# Patient Record
Sex: Male | Born: 1935 | Race: White | Hispanic: No | Smoking: Never smoker
Health system: Southern US, Community
[De-identification: ages and names within clinical notes are randomized; demographics above are authoritative.]

## PROBLEM LIST (undated history)

## (undated) DIAGNOSIS — F419 Anxiety disorder, unspecified: Secondary | ICD-10-CM

---

## 2019-11-10 ENCOUNTER — Encounter (HOSPITAL_COMMUNITY): Payer: Self-pay

## 2019-11-10 ENCOUNTER — Emergency Department (HOSPITAL_COMMUNITY): Payer: Medicare Other

## 2019-11-10 ENCOUNTER — Other Ambulatory Visit: Payer: Self-pay

## 2019-11-10 ENCOUNTER — Inpatient Hospital Stay (HOSPITAL_COMMUNITY)
Admission: EM | Admit: 2019-11-10 | Discharge: 2019-12-10 | DRG: 871 | Disposition: E | Payer: Medicare Other | Source: Skilled Nursing Facility | Attending: Internal Medicine | Admitting: Internal Medicine

## 2019-11-10 DIAGNOSIS — N179 Acute kidney failure, unspecified: Secondary | ICD-10-CM | POA: Diagnosis present

## 2019-11-10 DIAGNOSIS — I4891 Unspecified atrial fibrillation: Secondary | ICD-10-CM | POA: Diagnosis present

## 2019-11-10 DIAGNOSIS — I214 Non-ST elevation (NSTEMI) myocardial infarction: Secondary | ICD-10-CM | POA: Diagnosis present

## 2019-11-10 DIAGNOSIS — J1289 Other viral pneumonia: Secondary | ICD-10-CM | POA: Diagnosis present

## 2019-11-10 DIAGNOSIS — I469 Cardiac arrest, cause unspecified: Secondary | ICD-10-CM

## 2019-11-10 DIAGNOSIS — Z66 Do not resuscitate: Secondary | ICD-10-CM | POA: Diagnosis present

## 2019-11-10 DIAGNOSIS — A4189 Other specified sepsis: Principal | ICD-10-CM | POA: Diagnosis present

## 2019-11-10 DIAGNOSIS — J9601 Acute respiratory failure with hypoxia: Secondary | ICD-10-CM | POA: Diagnosis not present

## 2019-11-10 DIAGNOSIS — I10 Essential (primary) hypertension: Secondary | ICD-10-CM | POA: Diagnosis present

## 2019-11-10 DIAGNOSIS — G9340 Encephalopathy, unspecified: Secondary | ICD-10-CM | POA: Diagnosis present

## 2019-11-10 DIAGNOSIS — I200 Unstable angina: Secondary | ICD-10-CM

## 2019-11-10 DIAGNOSIS — F0390 Unspecified dementia without behavioral disturbance: Secondary | ICD-10-CM | POA: Diagnosis present

## 2019-11-10 DIAGNOSIS — U071 COVID-19: Secondary | ICD-10-CM | POA: Diagnosis present

## 2019-11-10 DIAGNOSIS — J1282 Pneumonia due to coronavirus disease 2019: Secondary | ICD-10-CM | POA: Diagnosis present

## 2019-11-10 HISTORY — DX: Anxiety disorder, unspecified: F41.9

## 2019-11-10 LAB — CBC WITH DIFFERENTIAL/PLATELET
Abs Immature Granulocytes: 0.12 10*3/uL — ABNORMAL HIGH (ref 0.00–0.07)
Basophils Absolute: 0 10*3/uL (ref 0.0–0.1)
Basophils Relative: 0 %
Eosinophils Absolute: 0 10*3/uL (ref 0.0–0.5)
Eosinophils Relative: 0 %
HCT: 51.3 % (ref 39.0–52.0)
Hemoglobin: 17.1 g/dL — ABNORMAL HIGH (ref 13.0–17.0)
Immature Granulocytes: 1 %
Lymphocytes Relative: 4 %
Lymphs Abs: 0.5 10*3/uL — ABNORMAL LOW (ref 0.7–4.0)
MCH: 31.8 pg (ref 26.0–34.0)
MCHC: 33.3 g/dL (ref 30.0–36.0)
MCV: 95.5 fL (ref 80.0–100.0)
Monocytes Absolute: 1.2 10*3/uL — ABNORMAL HIGH (ref 0.1–1.0)
Monocytes Relative: 8 %
Neutro Abs: 12.9 10*3/uL — ABNORMAL HIGH (ref 1.7–7.7)
Neutrophils Relative %: 87 %
Platelets: 170 10*3/uL (ref 150–400)
RBC: 5.37 MIL/uL (ref 4.22–5.81)
RDW: 14.8 % (ref 11.5–15.5)
WBC: 14.7 10*3/uL — ABNORMAL HIGH (ref 4.0–10.5)
nRBC: 0 % (ref 0.0–0.2)

## 2019-11-10 LAB — COMPREHENSIVE METABOLIC PANEL
ALT: 26 U/L (ref 0–44)
AST: 62 U/L — ABNORMAL HIGH (ref 15–41)
Albumin: 3.7 g/dL (ref 3.5–5.0)
Alkaline Phosphatase: 55 U/L (ref 38–126)
Anion gap: 17 — ABNORMAL HIGH (ref 5–15)
BUN: 45 mg/dL — ABNORMAL HIGH (ref 8–23)
CO2: 14 mmol/L — ABNORMAL LOW (ref 22–32)
Calcium: 9.6 mg/dL (ref 8.9–10.3)
Chloride: 112 mmol/L — ABNORMAL HIGH (ref 98–111)
Creatinine, Ser: 3.01 mg/dL — ABNORMAL HIGH (ref 0.61–1.24)
GFR calc Af Amer: 20 mL/min — ABNORMAL LOW (ref >60)
GFR calc non Af Amer: 18 mL/min — ABNORMAL LOW (ref >60)
Glucose, Bld: 138 mg/dL — ABNORMAL HIGH (ref 70–99)
Potassium: 4.4 mmol/L (ref 3.5–5.1)
Sodium: 143 mmol/L (ref 135–145)
Total Bilirubin: 1.2 mg/dL (ref 0.3–1.2)
Total Protein: 8.3 g/dL — ABNORMAL HIGH (ref 6.5–8.1)

## 2019-11-10 LAB — D-DIMER, QUANTITATIVE: D-Dimer, Quant: 5.57 ug/mL-FEU — ABNORMAL HIGH (ref 0.00–0.50)

## 2019-11-10 LAB — LACTATE DEHYDROGENASE: LDH: 346 U/L — ABNORMAL HIGH (ref 98–192)

## 2019-11-10 LAB — TRIGLYCERIDES: Triglycerides: 96 mg/dL (ref <150)

## 2019-11-10 LAB — POC SARS CORONAVIRUS 2 AG -  ED: SARS Coronavirus 2 Ag: POSITIVE — AB

## 2019-11-10 LAB — PROCALCITONIN: Procalcitonin: 4.19 ng/mL

## 2019-11-10 LAB — LACTIC ACID, PLASMA
Lactic Acid, Venous: 3.6 mmol/L (ref 0.5–1.9)
Lactic Acid, Venous: 3.4 mmol/L (ref 0.5–1.9)

## 2019-11-10 LAB — TROPONIN I (HIGH SENSITIVITY)
Troponin I (High Sensitivity): 1933 ng/L (ref <18)
Troponin I (High Sensitivity): 5133 ng/L (ref <18)

## 2019-11-10 LAB — FIBRINOGEN: Fibrinogen: 619 mg/dL — ABNORMAL HIGH (ref 210–475)

## 2019-11-10 MED ORDER — HEPARIN BOLUS VIA INFUSION
4500.0000 [IU] | Freq: Once | INTRAVENOUS | Status: AC
  Administered 2019-11-10: 4500 [IU] via INTRAVENOUS
  Filled 2019-11-10: qty 4500

## 2019-11-10 MED ORDER — SODIUM CHLORIDE 0.9 % IV SOLN: 100.0000 mg | INTRAVENOUS | Status: DC

## 2019-11-10 MED ORDER — METOPROLOL TARTRATE 5 MG/5ML IV SOLN
10.0000 mg | Freq: Once | INTRAVENOUS | Status: AC
  Administered 2019-11-10: 10 mg via INTRAVENOUS
  Filled 2019-11-10: qty 10

## 2019-11-10 MED ORDER — SODIUM CHLORIDE 0.9 % IV SOLN
2.0000 g | Freq: Once | INTRAVENOUS | Status: AC
  Administered 2019-11-10: 2 g via INTRAVENOUS
  Filled 2019-11-10: qty 2

## 2019-11-10 MED ORDER — VANCOMYCIN HCL IN DEXTROSE 1-5 GM/200ML-% IV SOLN
1000.0000 mg | Freq: Once | INTRAVENOUS | Status: AC
  Administered 2019-11-10: 1000 mg via INTRAVENOUS
  Filled 2019-11-10: qty 200

## 2019-11-10 MED ORDER — HEPARIN (PORCINE) 25000 UT/250ML-% IV SOLN
1200.0000 [IU]/h | INTRAVENOUS | Status: DC
  Administered 2019-11-10: 1200 [IU]/h via INTRAVENOUS
  Filled 2019-11-10: qty 250

## 2019-11-10 MED ORDER — ACETAMINOPHEN 325 MG PO TABS: 650.0000 mg | ORAL_TABLET | Freq: Four times a day (QID) | ORAL | Status: DC | PRN

## 2019-11-10 MED ORDER — DILTIAZEM HCL 25 MG/5ML IV SOLN
10.0000 mg | Freq: Once | INTRAVENOUS | Status: AC
  Administered 2019-11-10: 10 mg via INTRAVENOUS
  Filled 2019-11-10: qty 5

## 2019-11-10 MED ORDER — DILTIAZEM HCL-DEXTROSE 125-5 MG/125ML-% IV SOLN (PREMIX)
5.0000 mg/h | INTRAVENOUS | Status: DC
  Administered 2019-11-10: 5 mg/h via INTRAVENOUS
  Filled 2019-11-10: qty 125

## 2019-11-10 MED ORDER — ACETAMINOPHEN 650 MG RE SUPP: 650.0000 mg | Freq: Four times a day (QID) | RECTAL | Status: DC | PRN

## 2019-11-10 MED ORDER — ACETAMINOPHEN 650 MG RE SUPP
650.0000 mg | Freq: Once | RECTAL | Status: AC
  Administered 2019-11-10: 650 mg via RECTAL
  Filled 2019-11-10: qty 1

## 2019-11-10 MED ORDER — ONDANSETRON HCL 4 MG PO TABS: 4.0000 mg | ORAL_TABLET | Freq: Four times a day (QID) | ORAL | Status: DC | PRN

## 2019-11-10 MED ORDER — DEXAMETHASONE SODIUM PHOSPHATE 10 MG/ML IJ SOLN
10.0000 mg | Freq: Once | INTRAMUSCULAR | Status: AC
  Administered 2019-11-10: 10 mg via INTRAVENOUS
  Filled 2019-11-10: qty 1

## 2019-11-10 MED ORDER — DEXAMETHASONE 4 MG PO TABS: 6.0000 mg | ORAL_TABLET | ORAL | Status: DC

## 2019-11-10 MED ORDER — ONDANSETRON HCL 4 MG/2ML IJ SOLN: 4.0000 mg | Freq: Four times a day (QID) | INTRAMUSCULAR | Status: DC | PRN

## 2019-11-10 MED ORDER — SODIUM CHLORIDE 0.9 % IV SOLN
200.0000 mg | Freq: Once | INTRAVENOUS | Status: DC
  Filled 2019-11-10: qty 40

## 2019-11-11 LAB — C-REACTIVE PROTEIN

## 2019-11-11 LAB — ABO/RH: ABO/RH(D): O POS

## 2019-11-11 LAB — FERRITIN

## 2019-11-15 LAB — CULTURE, BLOOD (ROUTINE X 2)
Culture: NO GROWTH
Culture: NO GROWTH
Special Requests: ADEQUATE

## 2019-12-10 NOTE — H&P (Signed)
History and Physical    Mark Benson POE:[id number] DOB: [birth year] DOA: 12/05/2019  PCP: No primary care provider on file.   Patient coming from: From [place] home.  Chief Complaint: Palpitation shortness of breath.  History obtained from ER physician as patient has dementia and no family available.  No previous chart available to review.  HPI: Mark Benson is a 86 y.o. male with history of A. fib hypertension dementia DNR status was brought into the ER from the nursing home after patient became short of breath and was having palpitations.  Initially they thought it was anxiety but later on was found to be tachycardic and EMS was called and brought to the ER.  Patient had recently tested positive for COVID-19 infection.  ED Course: In the ER patient was short of breath hypoxic with chest x-ray showing.  EKG was showing A. fib with RVR.  Labs show creatinine of 3 anion gap 17 AST at 62 LDH 346 troponin XIX 33 lactic acid 3.6 procalcitonin 4.1 WBC 14.7 hemoglobin 17.1 platelets 170 Covid test was positive.  Blood cultures were obtained started on empiric antibiotics and for A. fib and non-ST relation MI ER physician discussed with Dr. Terrence Dupont on-call cardiologist.  Patient was started on Cardizem infusion heparin and cardiology advised as needed IV metoprolol to control heart rate.  Will be seeing patient in consult.  At the time of my exam patient is in acute respiratory failure.  No family available to communicate.  Appears edematous in the lower extremities.  Review of Systems: As per HPI, rest all negative.   Past Medical History:  Diagnosis Date  . Anxiety     History reviewed. No pertinent surgical history.   reports that he has never smoked. He has never used smokeless tobacco. No history on file for alcohol and drug.  No Known Allergies  Family History  Family history unknown: Yes    Prior to Admission medications   Not on File    Physical Exam:  Constitutional: Moderately built and nourished. Vitals:   12/04/2019 1916 12/09/2019 1940 12/09/2019 1956 11/24/2019 2010  BP: (!) 142/101 (!) 127/113 (!) 151/120 (!) 165/152  Pulse: (!) 110 68 (!) 151 (!) 158  Resp: 14  (!) 47 (!) 26  SpO2: 96% 97% 96% 94%  Weight:      Height:       Eyes: Anicteric no pallor. ENMT: No discharge from the ears eyes nose or mouth. Neck: No mass felt.  No neck rigidity.  JVD elevated. Respiratory: No rhonchi or crepitations. Cardiovascular: S1-S2 heard. Abdomen: Soft nontender bowel sound present. Musculoskeletal: Bilateral lower extremity edema present. Skin: No rash. Neurologic: Alert awake does not follow commands. Psychiatric: Appears confused.   Labs on Admission: I have personally reviewed following labs and imaging studies  CBC: Recent Labs  Lab 12/07/2019 1830  WBC 14.7*  NEUTROABS 12.9*  HGB 17.1*  HCT 51.3  MCV 95.5  PLT 315   Basic Metabolic Panel: Recent Labs  Lab 11/27/2019 1830  NA 143  K 4.4  CL 112*  CO2 14*  GLUCOSE 138*  BUN 45*  CREATININE 3.01*  CALCIUM 9.6   GFR: Estimated Creatinine Clearance: 17.8 mL/min (A) (by C-G formula based on SCr of 3.01 mg/dL (H)). Liver Function Tests: Recent Labs  Lab 11/26/2019 1830  AST 62*  ALT 26  ALKPHOS 55  BILITOT 1.2  PROT 8.3*  ALBUMIN 3.7   No results for input(s): LIPASE, AMYLASE in the last 168  hours. No results for input(s): AMMONIA in the last 168 hours. Coagulation Profile: No results for input(s): INR, PROTIME in the last 168 hours. Cardiac Enzymes: No results for input(s): CKTOTAL, CKMB, CKMBINDEX, TROPONINI in the last 168 hours. BNP (last 3 results) No results for input(s): PROBNP in the last 8760 hours. HbA1C: No results for input(s): HGBA1C in the last 72 hours. CBG: No results for input(s): GLUCAP in the last 168 hours. Lipid Profile: Recent Labs    11/12/2019 1830  TRIG 96   Thyroid Function Tests: No results for input(s): TSH, T4TOTAL, FREET4,  T3FREE, THYROIDAB in the last 72 hours. Anemia Panel: No results for input(s): VITAMINB12, FOLATE, FERRITIN, TIBC, IRON, RETICCTPCT in the last 72 hours. Urine analysis: No results found for: COLORURINE, APPEARANCEUR, LABSPEC, PHURINE, GLUCOSEU, HGBUR, BILIRUBINUR, KETONESUR, PROTEINUR, UROBILINOGEN, NITRITE, LEUKOCYTESUR Sepsis Labs: @LABRCNTIP (procalcitonin:4,lacticidven:4) )No results found for this or any previous visit (from the past 240 hour(s)).   Radiological Exams on Admission: Dg Chest Port 1 View  Result Date: 11/23/2019 CLINICAL DATA:  Shortness of breath and tachycardia EXAM: PORTABLE CHEST 1 VIEW COMPARISON:  None. FINDINGS: Cardiomegaly. Left greater than right mid to lower lung airspace disease. No pleural effusion. Aortic atherosclerosis. No pneumothorax. IMPRESSION: Left greater than right basilar airspace disease concerning for bilateral pneumonia. Cardiomegaly Electronically Signed   By: 14/01/2019 M.D.   On: 11/26/2019 18:42    EKG: Independently reviewed.  A. fib with RVR.  Assessment/Plan Principal Problem:   Acute respiratory failure with hypoxia (HCC) Active Problems:   Atrial fibrillation with RVR (HCC)   ARF (acute renal failure) (HCC)   Acute encephalopathy   Pneumonia due to COVID-19 virus   Non-ST elevation MI (NSTEMI) (HCC)    1. Acute respiratory failure with hypoxia likely from Covid pneumonia and also possible sepsis.  Patient has been started on empiric antibiotics follow cultures and will keep patient on Decadron and remdesivir for Covid pneumonia.  Continue to check lactic acid and procalcitonin. 2. A. fib with RVR likely precipitated by sepsis and hypoxic respiratory failure on Cardizem infusion and cardiology recommended as needed IV metoprolol as needed.  On heparin now.  Check TSH cardiac markers 2D echo. 3. Non-ST relation MI likely precipitate by A. fib with RVR -Dr. 14/01/2019 on-call cardiologist is aware.  Will be seeing patient in consult.   Presently on heparin will check 2D echo presently n.p.o.  Add aspirin.  On Cardizem for heart rate control and as needed metoprolol. 4. Acute renal failure no old labs to compare.  Closely monitor.  Check intake output follow UA if any. 5. History of dementia.  Patient is critically ill and will need inpatient status.  Family contact number not available to communicate.   DVT prophylaxis: Heparin infusion. Code Status: DNR as per the records transferred from the nursing home. Family Communication: Unable to reach family. Disposition Plan: To be determined. Consults called: Cardiology. Admission status: Inpatient.   Sharyn Lull MD [facility] Pager [phone].  If 7PM-7AM, please contact night-coverage [email or web address] Password [email or web address]  11/19/2019, 8:30 PM

## 2019-12-10 NOTE — Progress Notes (Signed)
ANTICOAGULATION CONSULT NOTE - Initial Consult  Pharmacy Consult for heparin Indication: atrial fibrillation  No Known Allergies  Patient Measurements: Height: 5\' 7"  (170.2 cm) Weight: 190 lb (86.2 kg) IBW/kg (Calculated) : 66.1 Heparin Dosing Weight: 83.7kg  Vital Signs: BP: 127/113 (12/02 1940) Pulse Rate: 68 (12/02 1940)  Labs: Recent Labs    12/05/2019 1830  HGB 17.1*  HCT 51.3  PLT 170  CREATININE 3.01*  TROPONINIHS 1,933*    Estimated Creatinine Clearance: 17.8 mL/min (A) (by C-G formula based on SCr of 3.01 mg/dL (H)).   Medical History: Past Medical History:  Diagnosis Date  . Anxiety     Medications:  Infusions:  . diltiazem (CARDIZEM) infusion 10 mg/hr (11/26/2019 1958)  . heparin      Assessment: 25 yom presented to the ED with respiratory distress. Recent diagnosis of COVID. Found to be in afib and now starting IV heparin. He is not on anticoagulation PTA. Baseline CBC is WNL. Tropnin and d-dimer are elevated.   Goal of Therapy:  Heparin level 0.3-0.7 units/ml Monitor platelets by anticoagulation protocol: Yes   Plan:  Heparin bolus 4500 units IV x 1 Heparin gtt 1200 units/hr Check an 8 hr heparin level Daily heparin level and CBC  Federico Maiorino, Rande Lawman 12/05/2019,8:11 PM

## 2019-12-10 NOTE — ED Provider Notes (Signed)
[facility] EMERGENCY DEPARTMENT Provider Note   CSN: [id number] Arrival date & time: 16-Nov-2019  1809     History   Chief Complaint Chief Complaint  Patient presents with  . Respiratory Distress    HPI Mark Benson is a 86 y.o. male hx of afib, HTN, recent Covid positive, here presenting with palpitations, rapid afib. Patient is from [facility] nursing home.  Patient had palpitation and the facility thought he had a panic attack today.  EMS was called because his heart rate was persistently elevated.  EMS noticed that he is in rapid A. fib.  Patient appears to be in moderate respiratory distress and has DNR in his chart. He is not on anticoagulation      The history is provided by the nursing home.  Level V- condition of patient   Past Medical History:  Diagnosis Date  . Anxiety     There are no active problems to display for this patient.     Home Medications    Prior to Admission medications   Not on File    Family History No family history on file.  Social History Social History   Tobacco Use  . Smoking status: Not on file  Substance Use Topics  . Alcohol use: Not on file  . Drug use: Not on file     Allergies   Patient has no known allergies.   Review of Systems Review of Systems  Respiratory: Positive for shortness of breath.   Cardiovascular: Positive for palpitations.  All other systems reviewed and are negative.    Physical Exam Updated Vital Signs BP (!) 127/113   Pulse 68   Resp 14   Ht 5\' 7"  (1.702 m)   Wt 86.2 kg   SpO2 97%   BMI 29.76 kg/m   Physical Exam Vitals signs and nursing note reviewed.  Constitutional:      Comments: Altered, confused.   HENT:     Head: Normocephalic.     Mouth/Throat:     Mouth: Mucous membranes are dry.  Eyes:     Extraocular Movements: Extraocular movements intact.     Pupils: Pupils are equal, round, and reactive to light.  Neck:     Musculoskeletal: Neck supple.   Cardiovascular:     Rate and Rhythm: Tachycardia present. Rhythm irregular.  Pulmonary:     Effort: Pulmonary effort is normal.     Comments: Diminished bilateral bases  Abdominal:     General: Abdomen is flat.     Palpations: Abdomen is soft.  Musculoskeletal: Normal range of motion.  Skin:    General: Skin is warm.     Capillary Refill: Capillary refill takes less than 2 seconds.  Neurological:     Comments: Confused, moving all extremities, difficulty following commands   Psychiatric:     Comments: Unable       ED Treatments / Results  Labs (all labs ordered are listed, but only abnormal results are displayed) Labs Reviewed  LACTIC ACID, PLASMA - Abnormal; Notable for the following components:      Result Value   Lactic Acid, Venous 3.6 (*)    All other components within normal limits  CBC WITH DIFFERENTIAL/PLATELET - Abnormal; Notable for the following components:   WBC 14.7 (*)    Hemoglobin 17.1 (*)    Neutro Abs 12.9 (*)    Lymphs Abs 0.5 (*)    Monocytes Absolute 1.2 (*)    Abs Immature Granulocytes 0.12 (*)  All other components within normal limits  COMPREHENSIVE METABOLIC PANEL - Abnormal; Notable for the following components:   Chloride 112 (*)    CO2 14 (*)    Glucose, Bld 138 (*)    BUN 45 (*)    Creatinine, Ser 3.01 (*)    Total Protein 8.3 (*)    AST 62 (*)    GFR calc non Af Amer 18 (*)    GFR calc Af Amer 20 (*)    Anion gap 17 (*)    All other components within normal limits  D-DIMER, QUANTITATIVE (NOT AT [facility]) - Abnormal; Notable for the following components:   D-Dimer, Quant 5.57 (*)    All other components within normal limits  LACTATE DEHYDROGENASE - Abnormal; Notable for the following components:   LDH 346 (*)    All other components within normal limits  FIBRINOGEN - Abnormal; Notable for the following components:   Fibrinogen 619 (*)    All other components within normal limits  POC SARS CORONAVIRUS 2 AG -  ED - Abnormal; Notable  for the following components:   SARS Coronavirus 2 Ag POSITIVE (*)    All other components within normal limits  TROPONIN I (HIGH SENSITIVITY) - Abnormal; Notable for the following components:   Troponin I (High Sensitivity) 1,933 (*)    All other components within normal limits  CULTURE, BLOOD (ROUTINE X 2)  CULTURE, BLOOD (ROUTINE X 2)  TRIGLYCERIDES  LACTIC ACID, PLASMA  PROCALCITONIN  FERRITIN  C-REACTIVE PROTEIN  TROPONIN I (HIGH SENSITIVITY)    EKG EKG Interpretation  Date/Time:  Wednesday November 10 2019 18:43:15 EST Ventricular Rate:  165 PR Interval:    QRS Duration: 79 QT Interval:  262 QTC Calculation: 434 R Axis:   -23 Text Interpretation: afib with RVR Borderline left axis deviation Anteroseptal infarct, old No previous ECGs available Confirmed by Wandra Arthurs ([id number]) on 11/12/2019 6:54:56 PM   Radiology Dg Chest Port 1 View  Result Date: 12/06/2019 CLINICAL DATA:  Shortness of breath and tachycardia EXAM: PORTABLE CHEST 1 VIEW COMPARISON:  None. FINDINGS: Cardiomegaly. Left greater than right mid to lower lung airspace disease. No pleural effusion. Aortic atherosclerosis. No pneumothorax. IMPRESSION: Left greater than right basilar airspace disease concerning for bilateral pneumonia. Cardiomegaly Electronically Signed   By: Donavan Foil M.D.   On: 11/16/2019 18:42    Procedures Procedures (including critical care time)  CRITICAL CARE Performed by: Wandra Arthurs   Total critical care time: 45 minutes  Critical care time was exclusive of separately billable procedures and treating other patients.  Critical care was necessary to treat or prevent imminent or life-threatening deterioration.  Critical care was time spent personally by me on the following activities: development of treatment plan with patient and/or surrogate as well as nursing, discussions with consultants, evaluation of patient's response to treatment, examination of patient, obtaining history  from patient or surrogate, ordering and performing treatments and interventions, ordering and review of laboratory studies, ordering and review of radiographic studies, pulse oximetry and re-evaluation of patient's condition.    Medications Ordered in ED Medications  diltiazem (CARDIZEM) 125 mg in dextrose 5% 125 mL (1 mg/mL) infusion (10 mg/hr Intravenous Rate/Dose Change 12/07/2019 1958)  vancomycin (VANCOCIN) IVPB 1000 mg/200 mL premix (1,000 mg Intravenous New Bag/Given 11/24/2019 1910)  diltiazem (CARDIZEM) injection 10 mg (10 mg Intravenous Given 11/18/2019 1841)  acetaminophen (TYLENOL) suppository 650 mg (650 mg Rectal Given 11/17/2019 1902)  ceFEPIme (MAXIPIME) 2 g in sodium chloride 0.9 %  100 mL IVPB (0 g Intravenous Stopped 11/30/2019 1939)  diltiazem (CARDIZEM) injection 10 mg (10 mg Intravenous Given 11/27/2019 1902)     Initial Impression / Assessment and Plan / ED Course  I have reviewed the triage vital signs and the nursing notes.  Pertinent labs & imaging results that were available during my care of the patient were reviewed by me and considered in my medical decision making (see chart for details).       Early CharsWilliam Reardon is a 86 y.o. male here presenting with rapid A. fib and altered mental status.  Patient recently tested positive for Covid.  Patient appears short of breath and is tachycardic and appears in rapid A. fib on arrival.  Patient does have DNR in the chart.  Will get Covid preadmission labs and give Cardizem for A. Fib.  8:09 PM HR in the 120-130s on cardizem drip. Trop over 1000. COVID positive, inflammatory markers elevated. Talked to Dr. Sharyn LullHarwani from cardiology. He recommend continue cardizem drip, heparin drip. Patient is NOT a cath candidate as he also has renal failure. He recommend prn lopressor as well. He states that hospitalist can call if they need formal consult.   11:09 PM After patient was admitted, he continued to be tachycardic. At 22:30 pm, I was called into  the room. He had no pulse at that time. Time of death is 22:35. Patient is DNR and no resuscitation was performed.  He likely had arrhythmia and MI causing his death. He does have COVID 19 as well.    Final Clinical Impressions(s) / ED Diagnoses   Final diagnoses:  None    ED Discharge Orders    None       Charlynne PanderYao, David Hsienta, MD 12/05/2019 2311

## 2019-12-10 NOTE — ED Triage Notes (Signed)
Pt arrived via [facility] from [facility] for SOB and whole body shaking. Pt COVID pos. EMS gave versed 5mg  IM to get pt's body to stop shaking. [place] gave pt motoprolol 50mg  at 1630. Pt is A-fib w/RVR

## 2019-12-10 NOTE — ED Notes (Signed)
Heparin verified w/Natosha Ouida Sills, Rn

## 2019-12-10 NOTE — ED Notes (Signed)
Report given to Amy, RN on 2W, but she stated she need to call [facility] to see if pt is appropriate.

## 2019-12-10 NOTE — ED Notes (Signed)
ED TO INPATIENT HANDOFF REPORT  ED Nurse Name and Phone #: Lorrin Goodell [phone]  S Name/Age/Gender Geradine Girt 86 y.o. male Room/Bed: 028C/028C  Code Status   Code Status: DNR  Home/SNF/Other Home  Is this baseline? No   Triage Complete: Triage complete  Chief Complaint Tachycardia, Resp distress  Triage Note Pt arrived via [facility] from [facility] for SOB and whole body shaking. Pt COVID pos. EMS gave versed 21m IM to get pt's body to stop shaking. [place] motoprol[place] 592mat 1630. Pt is A-fib w/RVR   Allergies No Known Allergies  Level of Care/Admitting Diagnosis ED Disposition    ED Disposition Condition Comment   Admit  [id number]]  Level of Care: Progressive [102]  Admit to Progressive based on following criteria: CARDIOVASCULAR & THORACIC of moderate stability with acute coronary syndrome symptoms/low risk myocardial infarction/hypertensive urgency/arrhythmias/heart failure potentially compromising stability and stable post cardiovascular intervention patients.  Covid Evaluation: Confirmed COVID Positive  Diagnosis: [facility] [903009]Admitting Physician: KARise [phone] Physician: KARise [phone] length of stay: past midnight tomorrow  Certification:: I certify this patient will need inpatient services for at least 2 midnights  PT Class (Do Not Modify): Inpatient [101]  PT Acc Code (Do Not Modify): Private [1]       B Medical/Surgery History Past Medical History:  Diagnosis Date  . Anxiety    History reviewed. No pertinent surgical history.   A IV Location/Drains/Wounds Patient Lines/Drains/Airways Status   Active Line/Drains/Airways    Name:   Placement date:   Placement time:   Site:   Days:   Peripheral IV 1212-29-2020ight Hand   1212-29-2020  -    Hand   less than 1   Peripheral IV 12December 29, 2020ight Antecubital   12Dec 29, 2020   1908    Antecubital   less than 1          Intake/Output Last 24 hours  Intake/Output Summary (Last 24 hours) at 1212-29-20211 Last data filed at 1229-Dec-2020016 Gross per 24 hour  Intake 300 ml  Output -  Net 300 ml    Labs/Imaging Results for orders placed or performed during the hospital encounter of 12December 29, 2020from the past 48 hour(s))  Lactic acid, plasma     Status: Abnormal   Collection Time: 1212/29/20206:30 PM  Result Value Ref Range   Lactic Acid, Venous 3.6 (HH) 0.5 - 1.9 mmol/L    Comment: CRITICAL RESULT CALLED TO, READ BACK BY AND VERIFIED WITH: A.BALauretta Grill924 [id number].MANNING Performed at [facility] [place] [id number] CBC WITH DIFFERENTIAL     Status: Abnormal   Collection Time: 12December 29, 20206:30 PM  Result Value Ref Range   WBC 14.7 (H) 4.0 - 10.5 K/uL   RBC 5.37 4.22 - 5.81 MIL/uL   Hemoglobin 17.1 (H) 13.0 - 17.0 g/dL   HCT 51.3 39.0 - 52.0 %   MCV 95.5 80.0 - 100.0 fL   MCH 31.8 26.0 - 34.0 pg   MCHC 33.3 30.0 - 36.0 g/dL   RDW 14.8 11.5 - 15.5 %   Platelets 170 150 - 400 K/uL   nRBC 0.0 0.0 - 0.2 %   Neutrophils Relative % 87 %   Neutro Abs 12.9 (H) 1.7 - 7.7 K/uL   Lymphocytes Relative 4 %   Lymphs Abs  0.5 (L) 0.7 - 4.0 K/uL   Monocytes Relative 8 %   Monocytes Absolute 1.2 (H) 0.1 - 1.0 K/uL   Eosinophils Relative 0 %   Eosinophils Absolute 0.0 0.0 - 0.5 K/uL   Basophils Relative 0 %   Basophils Absolute 0.0 0.0 - 0.1 K/uL   Immature Granulocytes 1 %   Abs Immature Granulocytes 0.12 (H) 0.00 - 0.07 K/uL    Comment: Performed at [facility] [address] [place], [place] [address]  Comprehensive metabolic panel     Status: Abnormal   Collection Time: 12-10-19  6:30 PM  Result Value Ref Range   Sodium 143 135 - 145 mmol/L   Potassium 4.4 3.5 - 5.1 mmol/L   Chloride 112 (H) 98 - 111 mmol/L   CO2 14 (L) 22 - 32 mmol/L   Glucose, Bld 138 (H) 70 - 99 mg/dL   BUN 45 (H) 8 - 23 mg/dL   Creatinine, Ser  3.01 (H) 0.61 - 1.24 mg/dL   Calcium 9.6 8.9 - 10.3 mg/dL   Total Protein 8.3 (H) 6.5 - 8.1 g/dL   Albumin 3.7 3.5 - 5.0 g/dL   AST 62 (H) 15 - 41 U/L   ALT 26 0 - 44 U/L   Alkaline Phosphatase 55 38 - 126 U/L   Total Bilirubin 1.2 0.3 - 1.2 mg/dL   GFR calc non Af Amer 18 (L) >60 mL/min   GFR calc Af Amer 20 (L) >60 mL/min   Anion gap 17 (H) 5 - 15    Comment: Performed at [facility], [place] [address], [place], [place] [address]  D-dimer, quantitative     Status: Abnormal   Collection Time: 2019-12-10  6:30 PM  Result Value Ref Range   D-Dimer, Quant 5.57 (H) 0.00 - 0.50 ug/mL-FEU    Comment: (NOTE) At the manufacturer cut-off of 0.50 ug/mL FEU, this assay has been documented to exclude PE with a sensitivity and negative predictive value of 97 to 99%.  At this time, this assay has not been approved by the FDA to exclude DVT/VTE. Results should be correlated with clinical presentation. Performed at [facility], [place] [address], [place],  [address]   Procalcitonin     Status: None   Collection Time: December 10, 2019  6:30 PM  Result Value Ref Range   Procalcitonin 4.19 ng/mL    Comment:        Interpretation: PCT > 2 ng/mL: Systemic infection (sepsis) is likely, unless other causes are known. (NOTE)       Sepsis PCT Algorithm           Lower Respiratory Tract                                      Infection PCT Algorithm    ----------------------------     ----------------------------         PCT < 0.25 ng/mL                PCT < 0.10 ng/mL         Strongly encourage             Strongly discourage   discontinuation of antibiotics    initiation of antibiotics    ----------------------------     -----------------------------       PCT 0.25 - 0.50 ng/mL  PCT 0.10 - 0.25 ng/mL               OR       >80% decrease in PCT            Discourage initiation of                                            antibiotics      Encourage discontinuation           of  antibiotics    ----------------------------     -----------------------------         PCT >= 0.50 ng/mL              PCT 0.26 - 0.50 ng/mL               AND       <80% decrease in PCT              Encourage initiation of                                             antibiotics       Encourage continuation           of antibiotics    ----------------------------     -----------------------------        PCT >= 0.50 ng/mL                  PCT > 0.50 ng/mL               AND         increase in PCT                  Strongly encourage                                      initiation of antibiotics    Strongly encourage escalation           of antibiotics                                     -----------------------------                                           PCT <= 0.25 ng/mL                                                 OR                                        > 80% decrease in PCT  Discontinue / Do not initiate                                             antibiotics Performed at [facility], [place] [address], [place], [place] [address]   Lactate dehydrogenase     Status: Abnormal   Collection Time: November 12, 2019  6:30 PM  Result Value Ref Range   LDH 346 (H) 98 - 192 U/L    Comment: Performed at [facility] [address], [place], [place] [address]  Fibrinogen     Status: Abnormal   Collection Time: 12-Nov-2019  6:30 PM  Result Value Ref Range   Fibrinogen 619 (H) 210 - 475 mg/dL    Comment: Performed at [facility] [address], [place], [place] [address]  Troponin I (High Sensitivity)     Status: Abnormal   Collection Time: 11-12-2019  6:30 PM  Result Value Ref Range   Troponin I (High Sensitivity) 1,933 (HH) <18 ng/L    Comment: CRITICAL RESULT CALLED TO, READ BACK BY AND VERIFIED WITH: M.[place] [id number] I.MANNING (NOTE) Elevated high sensitivity troponin I (hsTnI) values and significant  changes across serial  measurements may suggest ACS but many other  chronic and acute conditions are known to elevate hsTnI results.  Refer to the Links section for chest pain algorithms and additional  guidance. Performed at [facility], [place] [address], [place], [place] [address]   Triglycerides     Status: None   Collection Time: 12-Nov-2019  6:30 PM  Result Value Ref Range   Triglycerides 96 <150 mg/dL    Comment: Performed at [facility] [address] [place], [place] [address]  POC SARS Coronavirus 2 Ag-ED - Nasal Swab (BD Veritor Kit)     Status: Abnormal   Collection Time: 11-12-2019  7:37 PM  Result Value Ref Range   SARS Coronavirus 2 Ag POSITIVE (A) NEGATIVE    Comment: (NOTE) SARS-CoV-2 antigen PRESENT. Positive results indicate the presence of viral antigens, but clinical correlation with patient history and other diagnostic information is necessary to determine patient infection status.  Positive results do not rule out bacterial infection or co-infection  with other viruses. False positive results are rare but can occur, and confirmatory RT-PCR testing may be appropriate in some circumstances. The expected result is Negative. Fact Sheet for Patients: PodPark.tn Fact Sheet for Providers: GiftContent.is  This test is not yet approved or cleared by the [place] FDA and  has been authorized for detection and/or diagnosis of SARS-CoV-2 by FDA under an Emergency Use Authorization (EUA).  This EUA will remain in effect (meaning this test can be used) for the duration of  the COVID-19 declaration under Section 564(b)(1) of the Act, 21 U.S.C. section 360bbb-3(b)(1), unless the a uthorization is terminated or revoked sooner.   Lactic acid, plasma     Status: Abnormal   Collection Time: Nov 12, 2019  8:05 PM  Result Value Ref Range   Lactic Acid, Venous 3.4 (HH) 0.5 - 1.9 mmol/L    Comment: CRITICAL VALUE NOTED.  VALUE IS  CONSISTENT WITH PREVIOUSLY REPORTED AND CALLED VALUE. Performed at [facility], [place] [address], [place], [place] [address]   Troponin I (High Sensitivity)     Status: Abnormal   Collection Time: 11-12-2019  8:05 PM  Result Value Ref  Range   Troponin I (High Sensitivity) 5,133 (HH) <18 ng/L    Comment: CRITICAL VALUE NOTED.  VALUE IS CONSISTENT WITH PREVIOUSLY REPORTED AND CALLED VALUE. (NOTE) Elevated high sensitivity troponin I (hsTnI) values and significant  changes across serial measurements may suggest ACS but many other  chronic and acute conditions are known to elevate hsTnI results.  Refer to the Links section for chest pain algorithms and additional  guidance. Performed at [facility], [place] [address] [place], [place] [address]    Dg Chest Port 1 View  Result Date: December 05, 2019 CLINICAL DATA:  Shortness of breath and tachycardia EXAM: PORTABLE CHEST 1 VIEW COMPARISON:  None. FINDINGS: Cardiomegaly. Left greater than right mid to lower lung airspace disease. No pleural effusion. Aortic atherosclerosis. No pneumothorax. IMPRESSION: Left greater than right basilar airspace disease concerning for bilateral pneumonia. Cardiomegaly Electronically Signed   By: Donavan Foil M.D.   On: December 05, 2019 18:42    Pending Labs Unresulted Labs (From admission, onward)    Start     Ordered   11/12/19 0500  Heparin level (unfractionated)  Daily,   R     05-Dec-2019 2010   11/11/19 0500  Heparin level (unfractionated)  Once-Timed,   STAT     December 05, 2019 2010   11/11/19 0500  CBC  Daily,   R     Dec 05, 2019 2010   05-Dec-2019 2031  ABO/Rh  Once,   STAT     12-05-2019 2031   12-05-19 2030  TSH  Once,   STAT     12-05-19 2029   Dec 05, 2019 1820  Blood Culture (routine x 2)  BLOOD CULTURE X 2,   STAT     12/05/2019 1819   12-05-19 1820  Ferritin  Once,   STAT     December 05, 2019 1819   12/05/19 1820  C-reactive protein  Once,   STAT     Dec 05, 2019 1819          Vitals/Pain Today's Vitals   12-05-2019  1916 2019/12/05 1940 2019-12-05 1956 12/05/2019 2010  BP: (!) 142/101 (!) 127/113 (!) 151/120 (!) 165/152  Pulse: (!) 110 68 (!) 151 (!) 158  Resp: 14  (!) 47 (!) 26  SpO2: 96% 97% 96% 94%  Weight:      Height:        Isolation Precautions Airborne and Contact precautions  Medications Medications  diltiazem (CARDIZEM) 125 mg in dextrose 5% 125 mL (1 mg/mL) infusion (10 mg/hr Intravenous Rate/Dose Change 12-05-2019 1958)  heparin ADULT infusion 100 units/mL (25000 units/244m sodium chloride 0.45%) (1,200 Units/hr Intravenous New Bag/Given 112-27-20202031)  acetaminophen (TYLENOL) tablet 650 mg (has no administration in time range)    Or  acetaminophen (TYLENOL) suppository 650 mg (has no administration in time range)  ondansetron (ZOFRAN) tablet 4 mg (has no administration in time range)    Or  ondansetron (ZOFRAN) injection 4 mg (has no administration in time range)  dexamethasone (DECADRON) tablet 6 mg (has no administration in time range)  remdesivir 200 mg in sodium chloride 0.9 % 250 mL IVPB (has no administration in time range)    Followed by  remdesivir 100 mg in sodium chloride 0.9 % 250 mL IVPB (has no administration in time range)  diltiazem (CARDIZEM) injection 10 mg (10 mg Intravenous Given 1Dec 27, 20201841)  acetaminophen (TYLENOL) suppository 650 mg (650 mg Rectal Given 12020-12-271902)  vancomycin (VANCOCIN) IVPB 1000 mg/200 mL premix (0 mg Intravenous Stopped 112/27/20202016)  ceFEPIme (MAXIPIME) 2 g in sodium  chloride 0.9 % 100 mL IVPB (0 g Intravenous Stopped 11/21/19 1939)  diltiazem (CARDIZEM) injection 10 mg (10 mg Intravenous Given 11-21-19 1902)  metoprolol tartrate (LOPRESSOR) injection 10 mg (10 mg Intravenous Given 2019/11/21 2037)  dexamethasone (DECADRON) injection 10 mg (10 mg Intravenous Given 11-21-19 2034)  heparin bolus via infusion 4,500 Units (4,500 Units Intravenous Bolus from Bag 2019/11/21 2032)    Mobility non-ambulatory     Focused Assessments Pulmonary Assessment  Handoff:  Lung sounds: Bilateral Breath Sounds: Rhonchi, Expiratory wheezes O2 Device: Nasal Cannula O2 Flow Rate (L/min): 6 L/min      R Recommendations: See Admitting Provider Note  Report given to:   Additional Notes:

## 2019-12-10 DEATH — deceased

## 2020-12-06 IMAGING — DX DG CHEST 1V PORT
1 series · 1 of 1 positions shown · non-contrast
Comparison: None.

CLINICAL DATA: Shortness of breath and tachycardia

EXAM:
PORTABLE CHEST 1 VIEW

[chest ap]
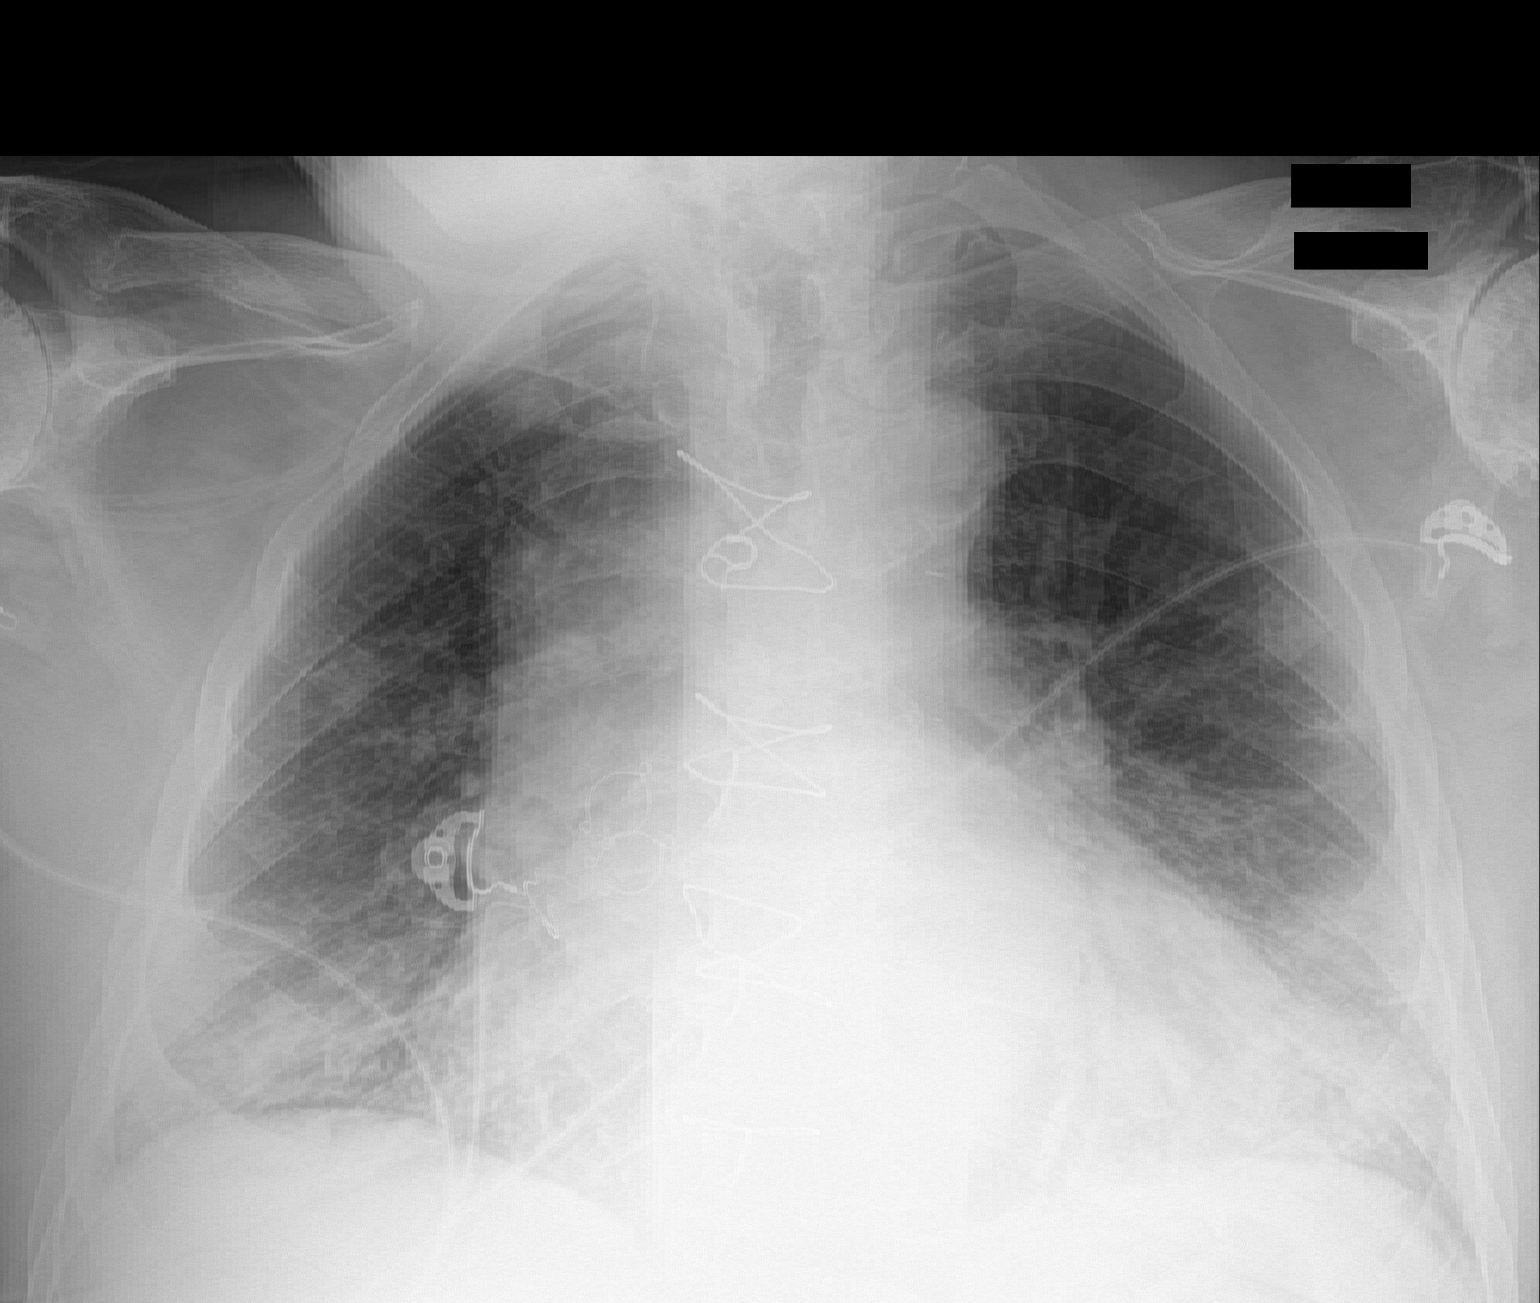

[1 of 1 positions shown; findings below may reference images not displayed]

FINDINGS: Cardiomegaly. Left greater than right mid to lower lung airspace
disease. No pleural effusion. Aortic atherosclerosis. No
pneumothorax.
IMPRESSION: Left greater than right basilar airspace disease concerning for
bilateral pneumonia. Cardiomegaly
